# Patient Record
Sex: Male | Born: 1980 | Race: White | Hispanic: No | Marital: Married | Smoking: Never smoker
Health system: Southern US, Community
[De-identification: ages and names within clinical notes are randomized; demographics above are authoritative.]

---

## 2009-03-27 ENCOUNTER — Emergency Department (HOSPITAL_COMMUNITY): Admission: EM | Admit: 2009-03-27 | Discharge: 2009-03-28 | Payer: Self-pay | Admitting: Emergency Medicine

## 2009-04-18 ENCOUNTER — Ambulatory Visit: Payer: Self-pay | Admitting: Vascular Surgery

## 2009-07-25 ENCOUNTER — Ambulatory Visit: Payer: Self-pay | Admitting: Vascular Surgery

## 2009-08-29 ENCOUNTER — Ambulatory Visit: Payer: Self-pay | Admitting: Vascular Surgery

## 2009-09-05 ENCOUNTER — Ambulatory Visit: Payer: Self-pay | Admitting: Vascular Surgery

## 2009-10-31 ENCOUNTER — Ambulatory Visit: Payer: Self-pay | Admitting: Vascular Surgery

## 2009-11-07 ENCOUNTER — Ambulatory Visit: Payer: Self-pay | Admitting: Vascular Surgery

## 2011-04-24 NOTE — Procedures (Signed)
DUPLEX DEEP VENOUS EXAM - LOWER EXTREMITY   INDICATION:  Follow up right greater saphenous vein ablation.   HISTORY:  Edema:  No  Trauma/Surgery:  Right greater saphenous vein ablation on October 31, 2009  Pain:  Moderate  PE:  No  Previous DVT:  No  Anticoagulants:  No  Other:   DUPLEX EXAM:                CFV   SFV   PopV  PTV    GSV                R  L  R  L  R  L  R   L  R  L  Thrombosis    o  o  o     o     o      +  Spontaneous   +  +  +     +     +      o  Phasic        +  +  +     +     +      o  Augmentation  +  +  +     +     +      o  Compressible  +  +  +     +     +      o  Competent     +  +  +     +     +      o   Legend:  + - yes  o - no  p - partial  D - decreased   IMPRESSION:  There does not appear to be any deep vein thrombosis noted  in the right leg.  However, the right greater saphenous vein appears  with thrombus proximally to the knee level.    _____________________________  Quita Skye. Hart Rochester, M.D.   CB/MEDQ  D:  11/07/2009  T:  11/08/2009  Job:  [id number]

## 2011-04-24 NOTE — Assessment & Plan Note (Signed)
OFFICE VISIT   Raymond Bell, Raymond Bell  DOB:  10-Nov-1981                                       09/05/2009  CHART#:[id number]   The patient is 1 week status post left greater saphenous vein laser  ablation plus multiple stab phlebectomies for painful varicosities.  He  has had some moderate discomfort in the left thigh over the ablation  site, which seems to have been more aggravating over the weekend but  seems to be improving slightly now.  He has had no distal edema and no  pain associated with the stab phlebectomies.  There is some moderate  ecchymosis in the distal thigh medially where the entrance site was  located.   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   I think he is coming along nicely.  He will wear his elastic stocking  for 1 more week and continue to take ibuprofen on a diminished dose of 2  tablets twice daily as necessary.  We will schedule his laser ablation  of the right leg in November as per his request.   Quita Skye. Hart Rochester, M.D.  Electronically Signed   JDL/MEDQ  D:  09/05/2009  T:  09/06/2009  Job:  [id number]

## 2011-04-24 NOTE — Procedures (Signed)
LOWER EXTREMITY VENOUS REFLUX EXAM   INDICATION:  Bilateral lower extremity varicose veins.   EXAM:  Using color-flow imaging and pulse Doppler spectral analysis, the  right and left common femoral, superficial femoral, popliteal, posterior  tibial, greater and lesser saphenous veins are evaluated.  There is no  evidence suggesting deep venous insufficiency in the right and left  lower extremity.   The right and left saphenofemoral junctions are not competent.  The  right and left GSV's are not competent with the caliber as described  below.   The right and left proximal short saphenous vein demonstrates  competency.   GSV Diameter (used if found to be incompetent only)                                            Right    Left  Proximal Greater Saphenous Vein           0.79 cm  1.08 cm  Proximal-to-mid-thigh                     0.74 cm  0.91 cm  Mid thigh                                 0.78 cm  0.79 cm  Mid-distal thigh                          0.82 cm  0.80 cm  Distal thigh                              0.82 cm  0.79 cm  Knee                                      0.82 cm  1.21 cm   IMPRESSION:  1. Right and left greater saphenous vein reflux is identified with the      caliber ranging from 1.21 cm to 0.74 cm knee to groin.  2. The right and left greater saphenous veins are not aneurysmal.  3. The right and left greater saphenous veins are not tortuous.  4. The deep venous system is competent.  5. The right and left lesser saphenous veins are competent.   ___________________________________________  Quita Skye. Hart Rochester, M.D.   AC/MEDQ  D:  04/18/2009  T:  04/18/2009  Job:  [id number]

## 2011-04-24 NOTE — Procedures (Signed)
DUPLEX DEEP VENOUS EXAM - LOWER EXTREMITY   INDICATION:  One week post great saphenous vein ablation and phlebectomy   HISTORY:  Edema:  yes  Trauma/Surgery:  no  Pain:  yes  PE:  no  Previous DVT:  no  Anticoagulants:  no  Other:   DUPLEX EXAM:                CFV   SFV   PopV  PTV    GSV                R  L  R  L  R  L  R   L  R  L  Thrombosis    0  0     0     0      0     +  Spontaneous   +  +     +     +      +     0  Phasic        +  +     +     +      +     0  Augmentation  +  +     +     +      +     0  Compressible  +  +     +     +      +     0  Competent     +  +     +     +      +     0   Legend:  + - yes  o - no  p - partial  D - decreased   IMPRESSION:  No evidence of deep vein thrombosis in the left lower  extremity.  The left great saphenous vein is ablated from the  saphenofemoral junction to the proximal calf.    _____________________________  Quita Skye. Hart Rochester, M.D.   CJ/MEDQ  D:  09/05/2009  T:  09/06/2009  Job:  [id number]

## 2011-04-24 NOTE — Assessment & Plan Note (Signed)
OFFICE VISIT   LAVALLE, SKODA  DOB:  1981/02/25                                       07/25/2009  CHART#:[id number]   The patient returns today for further follow-up regarding his bilateral  severe venous insufficiency.  He has bulging varicosities in both legs  with a history of a bleeding varix at the left ankle.  He has worn  elastic compression stockings (long-leg 20-mm - 30-mm gradient) and has  also tried elevating his legs and taking ibuprofen on a daily basis, but  has continued to have symptoms.  He also has noted that the darkening of  the skin in the lower ankle has progressed since I saw him 3 months ago.  He has had no further bleeding episodes.   On exam today, he continues to have bulging varicosities in the left leg  below the knee medially over the great saphenous system, extending into  the pretibial area, with a large patch of hyperpigmentation over the  area where the varix bled laterally proximal to the lateral malleolus.  On the right side he has a large cluster of bulging varicosities  anterior to the great saphenous vein near the patella which also are  contiguous with some bulging varicosities in the medial calf on the  right.  He has 1+ edema bilaterally with some thickening of the skin.   He clearly has worsened over the past 3 months in regards to his skin  color and consistency on the left and the size of his varicosities on  the right.  I think we should proceed with laser ablation of the left  great saphenous vein with multiple stab phlebectomies to be followed by  an identical procedure on the right side in the near future.  Will  proceed with precertification.  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   Quita Skye Hart Rochester, M.D.  Electronically Signed   JDL/MEDQ  D:  07/25/2009  T:  07/26/2009  Job:  [id number]

## 2011-04-24 NOTE — Consult Note (Signed)
NEW PATIENT CONSULTATION   Raymond Bell, Raymond Bell  DOB:  12/13/1980                                       04/18/2009  CHART#:[id number]   The patient was referred by Dr. Kevan Ny for evaluation of bilateral venous  insufficiency, recent history of a bleeding varix in the left leg.  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  This stopped initially and the  re-bled, and he went to the emergency department where a suture was  placed.  He has a long history of varicose veins in both legs,  particularly below the knee in the left side medially and with skin  darkening in the left ankle on the left side where the bleeding  occurred.  he has also had a large patch in the distal thigh in the  right side extending into the calf.  He has never had these evaluated  and has not worn elastic compression stockings, and is not able to  elevate his legs on a regular basis because he is on his feet as a  Engineer, site.  He has no history of deep vein thrombosis,  thrombophlebitis, stasis ulcers, but only the bleeding episode and  aching and throbbing discomfort in both legs as the day progresses,  particularly in the right thigh and calf, and also in the left calf and  ankle.  He has noticed this darkening of the skin over the last 3 years,  particularly on the left side with thickening and crustiness.   PAST MEDICAL HISTORY:  Negative for coronary artery disease and stroke  with father having varicose veins and diabetes.   SOCIAL HISTORY:  He is married, has 2 children, works as a Nurse, mental health, has not smoked since 2007, does not use alcohol.   REVIEW OF SYSTEMS:  Totally unremarkable.   ALLERGIES:  None known.   MEDICATIONS:  Zero.  None.   PHYSICAL EXAM:  Blood pressure is 140/80, heart rate 70, respirations  14.  Generally, he is a  healthy-appearing middle-aged male in no  apparent distress.  Alert and oriented x3.  Neck is supple, 3+ carotid  pulses palpable.  No bruits are audible.  Neurologic exam is normal.  No  palpable adenopathy in the neck.  Chest clear to auscultation.  Cardiovascular exam is regular rate and rhythm with no murmurs.  Abdomen  is soft and nontender with no palpable masses.  He has 3+ femoral,  popliteal, and dorsalis pedis pulses bilaterally.  Right leg has severe  cluster of varicose veins in the distal thigh medial to the knee over  the greater saphenous system extending down to the calf over the greater  saphenous system at the ankle with 1+ edema.  Left leg has lower  distribution of severe bulging varicosities over the great saphenous  system from the knee to the ankle medially with hyperpigmentation.  It  is actually laterally around the mid lateral malleolus where the  bleeding occurred.  1+ edema on the left.   Venous duplex exam done in the office today reveals gross reflux in both  great saphenous systems from the saphenofemoral junction to the knee  communicating with these extensive varicosities and normal deep system.  This man has severe symptomatic venous insufficiency of both legs.  We  will treat him initially with long leg elastic compression stockings (20  mm - 30 mm gradient) as well as elevation as much as his job will  permit, and ibuprofen on a daily basis.  If he has any further bleeding  episodes, he will be back in touch with [place] immediately.  Otherwise, in 3  months, he will return.  At that time, if he has had no improvement, I  think he would be a great candidate for laser ablation of the left  greater saphenous vein with multiple stab phlebectomies to be followed  by laser ablation of the right greater saphenous vein with multiple stab  phlebectomies to treat these problems.   Quita Skye Hart Rochester, M.D.  Electronically Signed   JDL/MEDQ  D:  04/18/2009  T:   04/18/2009  Job:  [id number]   cc:   Duncan Dull, M.D.

## 2011-04-24 NOTE — Assessment & Plan Note (Signed)
OFFICE VISIT   Raymond Bell, Raymond Bell  DOB:  [birth year]                                       11/07/2009  CHART#:[id number]   The patient is 1 week post laser ablation of his right great saphenous  vein with multiple stab phlebectomies for painful varicosities in the  right great saphenous system.  He has done very well with no significant  discomfort on this extremity.  He has had less pain in the thigh and  groin compared to the left side which was not severe.  He states at this  time the left leg feels very good, much better than it did prior to the  procedure.  His right leg is also making good progress with no pain  below the knee in the stab phlebectomy sites.  He has had no distal  edema.  He is wearing his elastic compression stocking and has concluded  his ibuprofen treatment.   Duplex scan today reveals no evidence of deep venous obstruction, total  occlusion of the great saphenous vein from the knee to the  saphenofemoral junction.   I am very pleased with his early result.  I encouraged him to increase  his activity and discontinue his stocking after 1 more week.  If he has  any further problems or questions, he will be in touch with [place] and see  [place] on a p.r.n. basis.   Quita Skye Hart Rochester, M.D.  Electronically Signed   JDL/MEDQ  D:  11/07/2009  T:  11/08/2009  Job:  [id number]

## 2017-11-04 ENCOUNTER — Emergency Department (HOSPITAL_COMMUNITY)
Admission: EM | Admit: 2017-11-04 | Discharge: 2017-11-04 | Disposition: A | Discharge status: Home / Self Care | Payer: Worker's Compensation | Attending: Emergency Medicine | Admitting: Emergency Medicine

## 2017-11-04 ENCOUNTER — Other Ambulatory Visit: Payer: Self-pay | Admitting: Occupational Medicine

## 2017-11-04 ENCOUNTER — Encounter (HOSPITAL_COMMUNITY): Payer: Self-pay

## 2017-11-04 ENCOUNTER — Ambulatory Visit: Payer: Self-pay

## 2017-11-04 ENCOUNTER — Emergency Department (HOSPITAL_COMMUNITY): Payer: Worker's Compensation

## 2017-11-04 ENCOUNTER — Other Ambulatory Visit: Payer: Self-pay

## 2017-11-04 DIAGNOSIS — S92531A Displaced fracture of distal phalanx of right lesser toe(s), initial encounter for closed fracture: Secondary | ICD-10-CM | POA: Diagnosis not present

## 2017-11-04 DIAGNOSIS — S92521A Displaced fracture of medial phalanx of right lesser toe(s), initial encounter for closed fracture: Secondary | ICD-10-CM | POA: Diagnosis not present

## 2017-11-04 DIAGNOSIS — S93124A Dislocation of metatarsophalangeal joint of right lesser toe(s), initial encounter: Secondary | ICD-10-CM | POA: Insufficient documentation

## 2017-11-04 DIAGNOSIS — Y990 Civilian activity done for income or pay: Secondary | ICD-10-CM | POA: Insufficient documentation

## 2017-11-04 DIAGNOSIS — S93104A Unspecified dislocation of right toe(s), initial encounter: Secondary | ICD-10-CM

## 2017-11-04 DIAGNOSIS — W208XXA Other cause of strike by thrown, projected or falling object, initial encounter: Secondary | ICD-10-CM | POA: Diagnosis not present

## 2017-11-04 DIAGNOSIS — Y929 Unspecified place or not applicable: Secondary | ICD-10-CM | POA: Diagnosis not present

## 2017-11-04 DIAGNOSIS — S92414A Nondisplaced fracture of proximal phalanx of right great toe, initial encounter for closed fracture: Secondary | ICD-10-CM | POA: Insufficient documentation

## 2017-11-04 DIAGNOSIS — M79671 Pain in right foot: Secondary | ICD-10-CM

## 2017-11-04 DIAGNOSIS — Z79899 Other long term (current) drug therapy: Secondary | ICD-10-CM | POA: Diagnosis not present

## 2017-11-04 DIAGNOSIS — Y939 Activity, unspecified: Secondary | ICD-10-CM | POA: Insufficient documentation

## 2017-11-04 DIAGNOSIS — S99921A Unspecified injury of right foot, initial encounter: Secondary | ICD-10-CM | POA: Diagnosis present

## 2017-11-04 MED ORDER — OXYCODONE-ACETAMINOPHEN 5-325 MG PO TABS: 1.0000 | ORAL_TABLET | ORAL | 0 refills | Status: DC | PRN

## 2017-11-04 MED ORDER — CEPHALEXIN 500 MG PO CAPS: 500.0000 mg | ORAL_CAPSULE | Freq: Four times a day (QID) | ORAL | 0 refills | Status: DC

## 2017-11-04 MED ORDER — IBUPROFEN 800 MG PO TABS: 800.0000 mg | ORAL_TABLET | Freq: Three times a day (TID) | ORAL | 0 refills | Status: DC

## 2017-11-04 MED ORDER — OXYCODONE-ACETAMINOPHEN 5-325 MG PO TABS
1.0000 | ORAL_TABLET | ORAL | Status: AC | PRN
  Administered 2017-11-04 (×2): 1 via ORAL
  Filled 2017-11-04 (×2): qty 1

## 2017-11-04 MED ORDER — BUPIVACAINE HCL (PF) 0.5 % IJ SOLN
10.0000 mL | Freq: Once | INTRAMUSCULAR | Status: AC
  Administered 2017-11-04: 10 mL
  Filled 2017-11-04 (×2): qty 10

## 2017-11-04 MED ORDER — LIDOCAINE HCL (PF) 1 % IJ SOLN
10.0000 mL | Freq: Once | INTRAMUSCULAR | Status: DC
  Filled 2017-11-04: qty 10

## 2017-11-04 MED ORDER — OXYCODONE-ACETAMINOPHEN 5-325 MG PO TABS
1.0000 | ORAL_TABLET | Freq: Once | ORAL | Status: AC
  Administered 2017-11-04: 1 via ORAL
  Filled 2017-11-04: qty 1

## 2017-11-04 NOTE — ED Provider Notes (Signed)
[facility] EMERGENCY DEPARTMENT Provider Note   CSN: [id number] Arrival date & time: 11/04/17  1151     History   Chief Complaint Chief Complaint  Patient presents with  . Foot Injury    HPI Raymond Bell is a 36 y.o. male with no significant past medical history presenting from urgent care with right foot injury. X-rays were obtained at urgent care and he was told that all toes were fractured. Patient was sent to the emergency department for further evaluation. He explains that he was at work when a heavy volleyball net base dropped on his right foot. Patient was given analgesia on arrival and reports improvement in pain. No numbness.  HPI  History reviewed. No pertinent past medical history.  There are no active problems to display for this patient.   History reviewed. No pertinent surgical history.     Home Medications    Prior to Admission medications   Medication Sig Start Date End Date Taking? Authorizing Provider  acetaminophen (TYLENOL) 325 MG tablet Take 650 mg by mouth every 6 (six) hours as needed for mild pain.   Yes [provider]  cephALEXin (KEFLEX) 500 MG capsule Take 1 capsule (500 mg total) by mouth 4 (four) times daily. 11/04/17   Emeline General, PA-C  ibuprofen (ADVIL,MOTRIN) 800 MG tablet Take 1 tablet (800 mg total) by mouth 3 (three) times daily. 11/04/17   Emeline General, PA-C  oxyCODONE-acetaminophen (PERCOCET/ROXICET) 5-325 MG tablet Take 1-2 tablets by mouth every 4 (four) hours as needed for severe pain. 11/04/17   Emeline General, PA-C    Family History No family history on file.  Social History Social History   Tobacco Use  . Smoking status: Never Smoker  . Smokeless tobacco: Never Used  Substance Use Topics  . Alcohol use: Yes    Comment: Occasionally   . Drug use: No     Allergies   Patient has no known allergies.   Review of Systems Review of Systems  Constitutional: Negative  for diaphoresis.  Musculoskeletal: Positive for arthralgias and myalgias.  Skin: Positive for color change and wound. Negative for pallor.  Neurological: Negative for syncope, weakness and numbness.     Physical Exam Updated Vital Signs BP 130/84 (BP Location: Right Arm)   Pulse 84   Temp 98.6 F (37 C) (Oral)   Resp 16   Ht 6\' 5"  (1.956 m)   Wt 108.9 kg (240 lb)   SpO2 97%   BMI 28.46 kg/m   Physical Exam  Constitutional: He is oriented to person, place, and time. He appears well-developed and well-nourished. No distress.  Afebrile, nontoxic-appearing, sitting comfortably in chair in no acute distress.  HENT:  Head: Normocephalic and atraumatic.  Eyes: Conjunctivae and EOM are normal.  Neck: Normal range of motion.  Cardiovascular: Normal rate, regular rhythm, normal heart sounds and intact distal pulses.  No murmur heard. Pulmonary/Chest: Effort normal and breath sounds normal. No stridor. No respiratory distress. He has no wheezes. He has no rales.  Musculoskeletal: Normal range of motion. He exhibits edema, tenderness and deformity.  Small puncture wound to dorsum of the foot at MTP. Patient was able to flex and extend the toes. Ecchymosis to 5th toe and dorsum of the foot, mild edema.  Neurological: He is alert and oriented to person, place, and time. No sensory deficit. He exhibits normal muscle tone.  Strong pulses, sensation intact. NVI. Patient was ambulating using his right heel  Skin: Skin  is warm and dry. He is not diaphoretic. No erythema. No pallor.  Psychiatric: He has a normal mood and affect.  Nursing note and vitals reviewed.    ED Treatments / Results  Labs (all labs ordered are listed, but only abnormal results are displayed) Labs Reviewed - No data to display  EKG  EKG Interpretation None       Radiology Dg Foot Complete Right  Result Date: 11/04/2017 CLINICAL DATA:  Right foot pain.  Blunt trauma. EXAM: RIGHT FOOT COMPLETE - 3+ VIEW  COMPARISON:  None. FINDINGS: Fractures are noted through the first through third proximal phalanges. Fracture fragments are mildly displaced. Presumed acute fracture in the middle phalanx of the fifth toe. Questionable subluxation or dislocation at the DIP joint of the right fourth toe. IMPRESSION: Comminuted mildly displaced fractures through the first through third proximal phalanges. Presumed acute fracture in the middle phalanx of the right fifth toe. Subluxation or dislocation of the right fourth toe PIP joint. Electronically Signed   By: Rolm Baptise M.D.   On: 11/04/2017 10:45    Procedures Procedures (including critical care time)  NERVE BLOCK Performed by: Emeline General Consent: Verbal consent obtained. Required items: required blood products, implants, devices, and special equipment available Time out: Immediately prior to procedure a "time out" was called to verify the correct patient, procedure, equipment, support staff and site/side marked as required.  Indication: dislocation Nerve block body site: 4th right toe  Preparation: Patient was prepped and draped in the usual sterile fashion. Needle gauge: 25 G Location technique: anatomical landmarks  Local anesthetic: marcaine 0.5%  Anesthetic total: 4 ml  Outcome: pain improved Patient tolerance: Patient tolerated the procedure well with no immediate complications.   Reduction of dislocation Date/Time: 5:18 PM Performed by: Emeline General Authorized by: Emeline General Consent: Verbal consent obtained. Risks and benefits: risks, benefits and alternatives were discussed Consent given by: patient Required items: required blood products, implants, devices, and special equipment available Time out: Immediately prior to procedure a "time out" was called to verify the correct patient, procedure, equipment, support staff and site/side marked as required.  Patient sedated: no  Vitals: Vital signs were monitored    Patient tolerance: Patient tolerated the procedure well with no immediate complications. Joint: interphalangeal 4th toe right Reduction technique: manual traction    Medications Ordered in ED Medications  bupivacaine (MARCAINE) 0.5 % injection 10 mL (not administered)  lidocaine (PF) (XYLOCAINE) 1 % injection 10 mL (not administered)  oxyCODONE-acetaminophen (PERCOCET/ROXICET) 5-325 MG per tablet 1 tablet (1 tablet Oral Given 11/04/17 1415)     Initial Impression / Assessment and Plan / ED Course  I have reviewed the triage vital signs and the nursing notes.  Pertinent labs & imaging results that were available during my care of the patient were reviewed by me and considered in my medical decision making (see chart for details).     Patient presenting with right foot injury with multiple phalangeal fractures and 4th toe dislocation. Pain was well managed while in the emergency department.  Consult placed to ortho Spoke to ortho PA Legrand Como who consulted with Dr. Lyla Glassing and recommends post-op hard sole shoe, keflex and follow up in office with Dr. Doreatha Martin  Digital block performed to right 4th toe 4th toe dislocation was reduced successfully.  Dressing applied, post op shoe and crutches provided to patient. He was well-appearing and in no distress prior to discharge. Normal vital signs and stable.  Discharge home with Keflex,  symptomatic relief and close follow-up with orthopedic.  Discussed strict return precautions and advised to return to the emergency department if experiencing any new or worsening symptoms. Instructions were understood and patient agreed with discharge plan. Final Clinical Impressions(s) / ED Diagnoses   Final diagnoses:  Closed nondisplaced fracture of proximal phalanx of right great toe, initial encounter  Closed displaced fracture of middle phalanx of lesser toe of right foot, initial encounter  Closed displaced fracture of distal phalanx of lesser toe  of right foot, initial encounter  Dislocation of phalanx of right foot, initial encounter    ED Discharge Orders        Ordered    cephALEXin (KEFLEX) 500 MG capsule  4 times daily,   Status:  Discontinued     11/04/17 1534    cephALEXin (KEFLEX) 500 MG capsule  4 times daily,   Status:  Discontinued     11/04/17 1632    oxyCODONE-acetaminophen (PERCOCET/ROXICET) 5-325 MG tablet  Every 4 hours PRN,   Status:  Discontinued     11/04/17 1632    ibuprofen (ADVIL,MOTRIN) 800 MG tablet  3 times daily,   Status:  Discontinued     11/04/17 1632    cephALEXin (KEFLEX) 500 MG capsule  4 times daily     11/04/17 1635    ibuprofen (ADVIL,MOTRIN) 800 MG tablet  3 times daily     11/04/17 1635    oxyCODONE-acetaminophen (PERCOCET/ROXICET) 5-325 MG tablet  Every 4 hours PRN     11/04/17 1635       Dossie Der 11/04/17 1745    Julianne Rice, MD 11/04/17 1909

## 2017-11-04 NOTE — ED Triage Notes (Signed)
Per Pt, Pt was at work where a base of a volleyball net was dropped on his right foot. Pt was sent to a clinic to be evaluated and noted to have five broken toes noted on the right foot. Pt was sent her for evaluation.

## 2017-11-04 NOTE — Discharge Instructions (Addendum)
Call Dr. Doreatha Martin office to schedule follow up appointment. Do not put weight on your foot, wear post op hard sole shoe and use crutches to ambulate.  Take ibuprofen 800 every 8 hours and Percocet as needed for breakthrough pain. Follow the rice protocol as outlined in these instructions

## 2017-11-04 NOTE — ED Notes (Signed)
States he had a pole from the volleyball court call on his foot. Was sent from [facility] for further follow up

## 2017-11-04 NOTE — ED Notes (Signed)
Awaiting marcaine from pharmacy

## 2017-11-04 NOTE — ED Notes (Signed)
Ice pack applied to foot and foot elevated.

## 2019-10-01 ENCOUNTER — Other Ambulatory Visit: Payer: Self-pay | Admitting: Family Medicine

## 2019-10-01 DIAGNOSIS — R1010 Upper abdominal pain, unspecified: Secondary | ICD-10-CM

## 2019-10-02 ENCOUNTER — Ambulatory Visit
Admission: RE | Admit: 2019-10-02 | Discharge: 2019-10-02 | Disposition: A | Discharge status: Home / Self Care | Payer: BC Managed Care – PPO | Source: Ambulatory Visit | Attending: Family Medicine | Admitting: Family Medicine

## 2019-10-02 DIAGNOSIS — R1010 Upper abdominal pain, unspecified: Secondary | ICD-10-CM

## 2019-10-09 ENCOUNTER — Other Ambulatory Visit: Payer: Self-pay | Admitting: Family Medicine

## 2019-10-09 DIAGNOSIS — K769 Liver disease, unspecified: Secondary | ICD-10-CM

## 2019-10-29 ENCOUNTER — Other Ambulatory Visit: Payer: Self-pay

## 2019-10-29 ENCOUNTER — Ambulatory Visit
Admission: RE | Admit: 2019-10-29 | Discharge: 2019-10-29 | Disposition: A | Discharge status: Home / Self Care | Payer: BC Managed Care – PPO | Source: Ambulatory Visit | Attending: Family Medicine | Admitting: Family Medicine

## 2019-10-29 DIAGNOSIS — K769 Liver disease, unspecified: Secondary | ICD-10-CM

## 2019-10-29 MED ORDER — GADOBENATE DIMEGLUMINE 529 MG/ML IV SOLN
20.0000 mL | Freq: Once | INTRAVENOUS | Status: AC | PRN
  Administered 2019-10-29: 20 mL via INTRAVENOUS

## 2019-10-31 ENCOUNTER — Other Ambulatory Visit: Payer: BC Managed Care – PPO

## 2020-01-30 ENCOUNTER — Other Ambulatory Visit: Payer: Self-pay | Admitting: Nurse Practitioner

## 2020-01-30 DIAGNOSIS — K769 Liver disease, unspecified: Secondary | ICD-10-CM

## 2020-02-24 ENCOUNTER — Ambulatory Visit
Admission: RE | Admit: 2020-02-24 | Discharge: 2020-02-24 | Disposition: A | Discharge status: Home / Self Care | Payer: BC Managed Care – PPO | Source: Ambulatory Visit | Attending: Nurse Practitioner | Admitting: Nurse Practitioner

## 2020-02-24 ENCOUNTER — Other Ambulatory Visit: Payer: Self-pay

## 2020-02-24 DIAGNOSIS — K769 Liver disease, unspecified: Secondary | ICD-10-CM

## 2020-02-24 MED ORDER — GADOXETATE DISODIUM 0.25 MMOL/ML IV SOLN
10.0000 mL | Freq: Once | INTRAVENOUS | Status: AC | PRN
  Administered 2020-02-24: 10 mL via INTRAVENOUS

## 2020-03-07 ENCOUNTER — Other Ambulatory Visit (HOSPITAL_COMMUNITY): Payer: Self-pay | Admitting: Nurse Practitioner

## 2020-03-07 DIAGNOSIS — K769 Liver disease, unspecified: Secondary | ICD-10-CM

## 2020-03-08 ENCOUNTER — Other Ambulatory Visit: Payer: Self-pay | Admitting: Radiology

## 2020-03-08 ENCOUNTER — Encounter (HOSPITAL_COMMUNITY): Payer: Self-pay | Admitting: Radiology

## 2020-03-08 NOTE — Progress Notes (Signed)
Raymond Bell Male, 39 y.o., December 24, 1980 MRN:  [id number] Phone:  [phone] (M) ... PCP:  London Pepper, MD Coverage:  Sherre Poot [facility] [place] With Radiology ([facility]-US 2) 03/11/2020 at 1:00 PM  FW: Biopsy Received: Today Message Contents  Donn Pierini D      Previous Messages   ----- Message -----  From: Arne Cleveland, MD  Sent: 03/07/2020  4:45 PM EDT  To: Lenore Cordia  Subject: RE: Biopsy                    Ok   [place] core R liver lesion  See MR and [place]   DDH    ----- Message -----  From: Lenore Cordia  Sent: 03/07/2020  3:45 PM EDT  To: Ir Procedure Requests  Subject: Biopsy                      Procedure Requested: Targeted Percutaneous Liver Biopsy    Reason for Procedure: Biopsy of Right hepatic lobe Lesion ( [place] vs Adenoma some T2 hyperintensity 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

## 2020-03-10 ENCOUNTER — Other Ambulatory Visit: Payer: Self-pay | Admitting: Radiology

## 2020-03-11 ENCOUNTER — Encounter (HOSPITAL_COMMUNITY): Payer: Self-pay

## 2020-03-11 ENCOUNTER — Other Ambulatory Visit (HOSPITAL_COMMUNITY): Payer: Self-pay | Admitting: Interventional Radiology

## 2020-03-11 ENCOUNTER — Ambulatory Visit (HOSPITAL_COMMUNITY)
Admission: RE | Admit: 2020-03-11 | Discharge: 2020-03-11 | Disposition: A | Discharge status: Home / Self Care | Payer: BC Managed Care – PPO | Source: Ambulatory Visit | Attending: Nurse Practitioner | Admitting: Nurse Practitioner

## 2020-03-11 ENCOUNTER — Other Ambulatory Visit (HOSPITAL_COMMUNITY): Payer: Self-pay | Admitting: Nurse Practitioner

## 2020-03-11 ENCOUNTER — Ambulatory Visit (HOSPITAL_COMMUNITY)
Admission: RE | Admit: 2020-03-11 | Discharge: 2020-03-11 | Disposition: A | Discharge status: Home / Self Care | Payer: BC Managed Care – PPO | Source: Ambulatory Visit | Attending: Interventional Radiology | Admitting: Interventional Radiology

## 2020-03-11 ENCOUNTER — Other Ambulatory Visit: Payer: Self-pay

## 2020-03-11 DIAGNOSIS — R160 Hepatomegaly, not elsewhere classified: Secondary | ICD-10-CM

## 2020-03-11 DIAGNOSIS — K769 Liver disease, unspecified: Secondary | ICD-10-CM

## 2020-03-11 DIAGNOSIS — K7689 Other specified diseases of liver: Secondary | ICD-10-CM | POA: Diagnosis not present

## 2020-03-11 LAB — PROTIME-INR
INR: 1 (ref 0.8–1.2)
Prothrombin Time: 12.7 seconds (ref 11.4–15.2)

## 2020-03-11 LAB — CBC WITH DIFFERENTIAL/PLATELET
Abs Immature Granulocytes: 0.02 10*3/uL (ref 0.00–0.07)
Basophils Absolute: 0 10*3/uL (ref 0.0–0.1)
Basophils Relative: 1 %
Eosinophils Absolute: 0 10*3/uL (ref 0.0–0.5)
Eosinophils Relative: 1 %
HCT: 47.9 % (ref 39.0–52.0)
Hemoglobin: 15.9 g/dL (ref 13.0–17.0)
Immature Granulocytes: 0 %
Lymphocytes Relative: 23 %
Lymphs Abs: 1.1 10*3/uL (ref 0.7–4.0)
MCH: 31.2 pg (ref 26.0–34.0)
MCHC: 33.2 g/dL (ref 30.0–36.0)
MCV: 94.1 fL (ref 80.0–100.0)
Monocytes Absolute: 0.3 10*3/uL (ref 0.1–1.0)
Monocytes Relative: 7 %
Neutro Abs: 3.1 10*3/uL (ref 1.7–7.7)
Neutrophils Relative %: 68 %
Platelets: 215 10*3/uL (ref 150–400)
RBC: 5.09 MIL/uL (ref 4.22–5.81)
RDW: 11.4 % — ABNORMAL LOW (ref 11.5–15.5)
WBC: 4.6 10*3/uL (ref 4.0–10.5)
nRBC: 0 % (ref 0.0–0.2)

## 2020-03-11 LAB — COMPREHENSIVE METABOLIC PANEL
ALT: 31 U/L (ref 0–44)
AST: 47 U/L — ABNORMAL HIGH (ref 15–41)
Albumin: 3.9 g/dL (ref 3.5–5.0)
Alkaline Phosphatase: 82 U/L (ref 38–126)
Anion gap: 10 (ref 5–15)
BUN: 17 mg/dL (ref 6–20)
CO2: 24 mmol/L (ref 22–32)
Calcium: 9.2 mg/dL (ref 8.9–10.3)
Chloride: 102 mmol/L (ref 98–111)
Creatinine, Ser: 0.95 mg/dL (ref 0.61–1.24)
GFR calc Af Amer: >60 mL/min (ref >60)
GFR calc non Af Amer: >60 mL/min (ref >60)
Glucose, Bld: 100 mg/dL — ABNORMAL HIGH (ref 70–99)
Potassium: 4.5 mmol/L (ref 3.5–5.1)
Sodium: 136 mmol/L (ref 135–145)
Total Bilirubin: 0.9 mg/dL (ref 0.3–1.2)
Total Protein: 6.9 g/dL (ref 6.5–8.1)

## 2020-03-11 MED ORDER — LIDOCAINE HCL (PF) 1 % IJ SOLN
INTRAMUSCULAR | Status: AC
  Filled 2020-03-11: qty 30

## 2020-03-11 MED ORDER — FENTANYL CITRATE (PF) 100 MCG/2ML IJ SOLN
INTRAMUSCULAR | Status: AC | PRN
  Administered 2020-03-11: 25 ug via INTRAVENOUS
  Administered 2020-03-11 (×2): 50 ug via INTRAVENOUS
  Administered 2020-03-11: 25 ug via INTRAVENOUS

## 2020-03-11 MED ORDER — MIDAZOLAM HCL 2 MG/2ML IJ SOLN
INTRAMUSCULAR | Status: AC
  Filled 2020-03-11: qty 2

## 2020-03-11 MED ORDER — FENTANYL CITRATE (PF) 100 MCG/2ML IJ SOLN
INTRAMUSCULAR | Status: AC
  Filled 2020-03-11: qty 2

## 2020-03-11 MED ORDER — GELATIN ABSORBABLE 12-7 MM EX MISC
CUTANEOUS | Status: AC
  Filled 2020-03-11: qty 1

## 2020-03-11 MED ORDER — MIDAZOLAM HCL 2 MG/2ML IJ SOLN
INTRAMUSCULAR | Status: AC | PRN
  Administered 2020-03-11: 1 mg via INTRAVENOUS
  Administered 2020-03-11 (×3): 0.5 mg via INTRAVENOUS

## 2020-03-11 NOTE — Discharge Instructions (Signed)
Liver Biopsy, Care After These instructions give you information on caring for yourself after your procedure. Your doctor may also give you more specific instructions. Call your doctor if you have any problems or questions after your procedure. What can I expect after the procedure? After the procedure, it is common to have:  Pain and soreness where the biopsy was done.  Bruising around the area where the biopsy was done.  Sleepiness and be tired for a few days. Follow these instructions at home: Medicines  Take over-the-counter and prescription medicines only as told by your doctor.  If you were prescribed an antibiotic medicine, take it as told by your doctor. Do not stop taking the antibiotic even if you start to feel better.  Do not take medicines such as aspirin and ibuprofen. These medicines can thin your blood. Do not take these medicines unless your doctor tells you to take them.  If you are taking prescription pain medicine, take actions to prevent or treat constipation. Your doctor may recommend that you: ? Drink enough fluid to keep your pee (urine) clear or pale yellow. ? Take over-the-counter or prescription medicines. ? Eat foods that are high in fiber, such as fresh fruits and vegetables, whole grains, and beans. ? Limit foods that are high in fat and processed sugars, such as fried and sweet foods. Caring for your cut  Follow instructions from your doctor about how to take care of your cuts from surgery (incisions). Make sure you: ? Wash your hands with soap and water before you change your bandage (dressing). If you cannot use soap and water, use hand sanitizer. ? Remove your bandage as told by your doctor. In 24 hours  Check your cuts every day for signs of infection. Check for: ? Redness, swelling, or more pain. ? Fluid or blood. ? Pus or a bad smell. ? Warmth.  Do not take baths, swim, or use a hot tub until your doctor says it is okay to do  so. Activity   Rest at home for 1-2 days or as told by your doctor. ? Avoid sitting for a long time without moving. Get up to take short walks every 1-2 hours.  Return to your normal activities as told by your doctor. Ask what activities are safe for you.  Do not do these things in the first 24 hours: ? Drive. ? Use machinery. ? Take a bath or shower.  Do not lift more than 10 pounds (4.5 kg) or play contact sports for the first 2 weeks. General instructions   Do not drink alcohol in the first week after the procedure.  Have someone stay with you for at least 24 hours after the procedure.  Get your test results. Ask your doctor or the department that is doing the test: ? When will my results be ready? ? How will I get my results? ? What are my treatment options? ? What other tests do I need? ? What are my next steps?  Keep all follow-up visits as told by your doctor. This is important. Contact a doctor if:  A cut bleeds and leaves more than just a small spot of blood.  A cut is red, puffs up (swells), or hurts more than before.  Fluid or something else comes from a cut.  A cut smells bad.  You have a fever or chills. Get help right away if:  You have swelling, bloating, or pain in your belly (abdomen).  You get dizzy or faint.    You have a rash.  You feel sick to your stomach (nauseous) or throw up (vomit).  You have trouble breathing, feel short of breath, or feel faint.  Your chest hurts.  You have problems talking or seeing.  You have trouble with your balance or moving your arms or legs. Summary  After the procedure, it is common to have pain, soreness, bruising, and tiredness.  Your doctor will tell you how to take care of yourself at home. Change your bandage, take your medicines, and limit your activities as told by your doctor.  Call your doctor if you have symptoms of infection. Get help right away if your belly swells, your cut bleeds a lot, or  you have trouble talking or breathing. This information is not intended to replace advice given to you by your health care provider. Make sure you discuss any questions you have with your health care provider. Document Revised: 12/06/2017 Document Reviewed: 12/06/2017 Elsevier Patient Education  2020 [employer]  Moderate Conscious Sedation, Adult, Care After These instructions provide you with information about caring for yourself after your procedure. Your health care provider may also give you more specific instructions. Your treatment has been planned according to current medical practices, but problems sometimes occur. Call your health care provider if you have any problems or questions after your procedure. What can I expect after the procedure? After your procedure, it is common:  To feel sleepy for several hours.  To feel clumsy and have poor balance for several hours.  To have poor judgment for several hours.  To vomit if you eat too soon. Follow these instructions at home: For at least 24 hours after the procedure:   Do not: ? Participate in activities where you could fall or become injured. ? Drive. ? Use heavy machinery. ? Drink alcohol. ? Take sleeping pills or medicines that cause drowsiness. ? Make important decisions or sign legal documents. ? Take care of children on your own.  Rest. Eating and drinking  Follow the diet recommended by your health care provider.  If you vomit: ? Drink water, juice, or soup when you can drink without vomiting. ? Make sure you have little or no nausea before eating solid foods. General instructions  Have a responsible adult stay with you until you are awake and alert.  Take over-the-counter and prescription medicines only as told by your health care provider.  If you smoke, do not smoke without supervision.  Keep all follow-up visits as told by your health care provider. This is important. Contact a health care provider  if:  You keep feeling nauseous or you keep vomiting.  You feel light-headed.  You develop a rash.  You have a fever. Get help right away if:  You have trouble breathing. This information is not intended to replace advice given to you by your health care provider. Make sure you discuss any questions you have with your health care provider. Document Revised: 11/08/2017 Document Reviewed: 03/17/2016 Elsevier Patient Education  2020 [employer] Inc[employer]  

## 2020-03-11 NOTE — Sedation Documentation (Signed)
Per MD we will need to do this procedure in ct scan. Pt has been moved to ct scan, on monitor. Pt is alert and oriented x4, npo. Consent signed

## 2020-03-11 NOTE — Discharge Instructions (Addendum)
Liver Biopsy, Care After These instructions give you information about how to care for yourself after your procedure. Your health care provider may also give you more specific instructions. If you have problems or questions, contact your health care provider. What can I expect after the procedure? After your procedure, it is common to have:  Pain and soreness in the area where the biopsy was done.  Bruising around the area where the biopsy was done.  Sleepiness and fatigue for 1-2 days. Follow these instructions at home: Medicines  Take over-the-counter and prescription medicines only as told by your health care provider.  If you were prescribed an antibiotic medicine, take it as told by your health care provider. Do not stop taking the antibiotic even if you start to feel better.  Do not take medicines such as aspirin and ibuprofen unless your health care provider tells you to take them. These medicines thin your blood and can increase the risk of bleeding.  If you are taking prescription pain medicine, take actions to prevent or treat constipation. Your health care provider may recommend that you: ? Drink enough fluid to keep your urine pale yellow. ? Eat foods that are high in fiber, such as fresh fruits and vegetables, whole grains, and beans. ? Limit foods that are high in fat and processed sugars, such as fried or sweet foods. ? Take an over-the-counter or prescription medicine for constipation. Incision care  Follow instructions from your health care provider about how to take care of your incision. Make sure you: ? Wash your hands with soap and water before you change your bandage (dressing). If soap and water are not available, use hand sanitizer. ? Change your dressing as told by your health care provider. ? Leave stitches (sutures), skin glue, or adhesive strips in place. These skin closures may need to stay in place for 2 weeks or longer. If adhesive strip edges start to  loosen and curl up, you may trim the loose edges. Do not remove adhesive strips completely unless your health care provider tells you to do that.  Check your incision area every day for signs of infection. Check for: ? Redness, swelling, or pain. ? Fluid or blood. ? Warmth. ? Pus or a bad smell.  Do not take baths, swim, or use a hot tub until your health care provider says it is okay to do so. Activity   Rest at home for 1-2 days, or as directed by your health care provider. ? Avoid sitting for a long time without moving. Get up to take short walks every 1-2 hours. This is important to improve blood flow and breathing. Ask for help if you feel weak or unsteady.  Return to your normal activities as told by your health care provider. Ask your health care provider what activities are safe for you.  Do not drive or use heavy machinery while taking prescription pain medicine.  Do not lift anything that is heavier than 10 lb (4.5 kg), or the limit that your health care provider tells you, until he or she says that it is safe.  Do not play contact sports for 2 weeks after the procedure. General instructions   Do not drink alcohol in the first week after the procedure.  Have someone stay with you for at least 24 hours after the procedure.  It is your responsibility to obtain your test results. Ask your health care provider, or the department that is doing the test: ? When will my   results be ready? ? How will I get my results? ? What are my treatment options? ? What other tests do I need? ? What are my next steps?  Keep all follow-up visits as told by your health care provider. This is important. Contact a health care provider if:  You have increased bleeding from an incision, resulting in more than a small spot of blood.  You have redness, swelling, or increasing pain in any incisions.  You notice a discharge or a bad smell coming from any of your incisions.  You have a fever or  chills. Get help right away if:  You develop swelling, bloating, or pain in your abdomen.  You become dizzy or faint.  You develop a rash.  You have nausea or you vomit.  You faint, or you have shortness of breath or difficulty breathing.  You develop chest pain.  You have problems with your speech or vision.  You have trouble with your balance or moving your arms or legs. Summary  After the liver biopsy, it is common to have pain, soreness, and bruising in the area, as well as sleepiness and fatigue.  Take over-the-counter and prescription medicines only as told by your health care provider.  Follow instructions from your health care provider about how to care for your incision. Check the incision area daily for signs of infection. This information is not intended to replace advice given to you by your health care provider. Make sure you discuss any questions you have with your health care provider. Document Revised: 01/19/2019 Document Reviewed: 12/06/2017 Elsevier Patient Education  2020 [employer] Inc[employer] Moderate Conscious Sedation, Adult Sedation is the use of medicines to promote relaxation and relieve discomfort and anxiety. Moderate conscious sedation is a type of sedation. Under moderate conscious sedation, you are less alert than normal, but you are still able to respond to instructions, touch, or both. Moderate conscious sedation is used during short medical and dental procedures. It is milder than deep sedation, which is a type of sedation under which you cannot be easily woken up. It is also milder than general anesthesia, which is the use of medicines to make you unconscious. Moderate conscious sedation allows you to return to your regular activities sooner. Tell a health care provider about:  Any allergies you have.  All medicines you are taking, including vitamins, herbs, eye drops, creams, and over-the-counter medicines.  Use of steroids (by mouth or creams).  Any  problems you or family members have had with sedatives and anesthetic medicines.  Any blood disorders you have.  Any surgeries you have had.  Any medical conditions you have, such as sleep apnea.  Whether you are pregnant or may be pregnant.  Any use of cigarettes, alcohol, marijuana, or street drugs. What are the risks? Generally, this is a safe procedure. However, problems may occur, including:  Getting too much medicine (oversedation).  Nausea.  Allergic reaction to medicines.  Trouble breathing. If this happens, a breathing tube may be used to help with breathing. It will be removed when you are awake and breathing on your own.  Heart trouble.  Lung trouble. What happens before the procedure? Staying hydrated Follow instructions from your health care provider about hydration, which may include:  Up to 2 hours before the procedure - you may continue to drink clear liquids, such as water, clear fruit juice, black coffee, and plain tea. Eating and drinking restrictions Follow instructions from your health care provider about eating and drinking, which   may include:  8 hours before the procedure - stop eating heavy meals or foods such as meat, fried foods, or fatty foods.  6 hours before the procedure - stop eating light meals or foods, such as toast or cereal.  6 hours before the procedure - stop drinking milk or drinks that contain milk.  2 hours before the procedure - stop drinking clear liquids. Medicine Ask your health care provider about:  Changing or stopping your regular medicines. This is especially important if you are taking diabetes medicines or blood thinners.  Taking medicines such as aspirin and ibuprofen. These medicines can thin your blood. Do not take these medicines before your procedure if your health care provider instructs you not to.  Tests and exams  You will have a physical exam.  You may have blood tests done to show: ? How well your  kidneys and liver are working. ? How well your blood can clot. General instructions  Plan to have someone take you home from the hospital or clinic.  If you will be going home right after the procedure, plan to have someone with you for 24 hours. What happens during the procedure?  An IV tube will be inserted into one of your veins.  Medicine to help you relax (sedative) will be given through the IV tube.  The medical or dental procedure will be performed. What happens after the procedure?  Your blood pressure, heart rate, breathing rate, and blood oxygen level will be monitored often until the medicines you were given have worn off.  Do not drive for 24 hours. This information is not intended to replace advice given to you by your health care provider. Make sure you discuss any questions you have with your health care provider. Document Revised: 11/08/2017 Document Reviewed: 03/17/2016 Elsevier Patient Education  2020 Elsevier Inc.  

## 2020-03-11 NOTE — Progress Notes (Signed)
Discharge instructions reviewed with patient and wife Raymond Bell. Verbalized understanding and questions were answered.

## 2020-03-11 NOTE — H&P (Signed)
Chief Complaint: Patient was seen in consultation today for liver lesion biopsy at the request of Drazek,Dawn  Referring Physician(s): Drazek,Dawn  Supervising Physician: Daryll Brod  Patient Status: [facility] - Out-pt  History of Present Illness: Raymond Bell is a 39 y.o. 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 Recommend follow-up by MRI without and with Eovist contrast in 6 months, after cessation of oral contraceptives or other exogenous steroids if applicable  Was referred to Wendie Chess NP for consultation Another MRI recommended: 02/24/20: IMPRESSION: Unchanged size of 5.6 cm mass of the right lobe of the liver, which demonstrates imaging features consistent with focal nodular hyperplasia, confidently diagnosed on this examination with inclusion of delayed hepatobiliary phase.  Per D Drazek note: Reason for Procedure: Biopsy of Right hepatic lobe Lesion ( FNH vs Adenoma some T2 hyperintensity raising concern for inflammatory adenoma)   Scheduled now for biopsy Still with some abd pain; denies N/V/D/C or wt loss  History reviewed. No pertinent past medical history.  History reviewed. No pertinent surgical history.  Allergies: Patient has no known allergies.  Medications: Prior to Admission medications   Medication Sig Start Date End Date Taking? Authorizing Provider  acetaminophen (TYLENOL) 325 MG tablet Take 650 mg by mouth every 6 (six) hours as needed for mild pain.   Yes [provider]  MELATONIN PO Take 1 tablet by mouth at bedtime.   Yes [provider]  naproxen sodium (ALEVE) 220 MG tablet Take 440 mg by mouth daily as needed (pain).   Yes [provider]     History reviewed. No pertinent family  history.  Social History   Socioeconomic History  . Marital status: Married    Spouse name: Not on file  . Number of children: Not on file  . Years of education: Not on file  . Highest education level: Not on file  Occupational History  . Not on file  Tobacco Use  . Smoking status: Never Smoker  . Smokeless tobacco: Never Used  Substance and Sexual Activity  . Alcohol use: Yes    Comment: Occasionally   . Drug use: No  . Sexual activity: Not on file  Other Topics Concern  . Not on file  Social History Narrative  . Not on file   Social Determinants of Health   Financial Resource Strain:   . Difficulty of Paying Living Expenses:   Food Insecurity:   . Worried About Charity fundraiser in the Last Year:   . Arboriculturist in the Last Year:   Transportation Needs:   . Film/video editor (Medical):   Marland Kitchen Lack of Transportation (Non-Medical):   Physical Activity:   . Days of Exercise per Week:   . Minutes of Exercise per Session:   Stress:   . Feeling of Stress :   Social Connections:   . Frequency of Communication with Friends and Family:   . Frequency of Social Gatherings with Friends and Family:   . Attends Religious Services:   . Active Member of Clubs or Organizations:   . Attends Archivist Meetings:   Marland Kitchen Marital Status:      Review of Systems: A 12 point ROS discussed and pertinent positives are indicated in the HPI above.  All other  systems are negative.  Review of Systems  Constitutional: Negative for activity change, appetite change, fatigue and unexpected weight change.  Respiratory: Negative for cough and shortness of breath.   Cardiovascular: Negative for chest pain.  Gastrointestinal: Positive for abdominal pain.  Musculoskeletal: Negative for back pain.  Neurological: Negative for weakness.  Psychiatric/Behavioral: Negative for behavioral problems and confusion.    Vital Signs: BP (!) 123/95   Pulse (!) 59   Temp 97.7 F (36.5 C)  (Oral)   Resp 17   Ht 6\' 5"  (1.956 m)   Wt 250 lb (113.4 kg)   SpO2 100%   BMI 29.65 kg/m   Physical Exam Vitals reviewed.  Cardiovascular:     Rate and Rhythm: Normal rate and regular rhythm.     Heart sounds: Normal heart sounds.  Pulmonary:     Effort: Pulmonary effort is normal.     Breath sounds: Normal breath sounds.  Abdominal:     Palpations: Abdomen is soft.     Tenderness: There is no abdominal tenderness.  Musculoskeletal:        General: Normal range of motion.  Skin:    General: Skin is warm.  Neurological:     Mental Status: He is alert and oriented to person, place, and time.  Psychiatric:        Behavior: Behavior normal.        Thought Content: Thought content normal.        Judgment: Judgment normal.     Imaging: MR ABDOMEN WWO CONTRAST  Result Date: 02/24/2020 CLINICAL DATA:  Follow-up characterization of right hepatic mass EXAM: MRI ABDOMEN WITHOUT AND WITH CONTRAST TECHNIQUE: Multiplanar multisequence MR imaging of the abdomen was performed both before and after the administration of intravenous contrast. CONTRAST:  69mL EOVIST GADOXETATE DISODIUM 0.25 MOL/L IV SOLN COMPARISON:  MR abdomen, 09/28/2019, abdominal ultrasound, 10/02/2019 FINDINGS: Lower chest: No acute findings. Hepatobiliary: There is a redemonstrated mass of the right lobe of the liver, hepatic segment VII, which demonstrates intrinsic T1 isointensity, very subtle T2 hyperintensity, a very subtle associated diffusion abnormality, and vivid, homogeneous arterial contrast hyperenhancement. Mass is unchanged in size measuring 5.6 x 5.2 cm. There is no associated lipid signal loss, evidence of central scar, or washout. There is persistent hyperintensity on 20 minutes delayed hepatic biliary phase. Pancreas: No mass, inflammatory changes, or other parenchymal abnormality identified. Spleen:  Within normal limits in size and appearance. Adrenals/Urinary Tract: No masses identified. No evidence of  hydronephrosis. Stomach/Bowel: Visualized portions within the abdomen are unremarkable. Vascular/Lymphatic: No pathologically enlarged lymph nodes identified. No abdominal aortic aneurysm demonstrated. Other:  None. Musculoskeletal: No suspicious bone lesions identified. IMPRESSION: Unchanged size of 5.6 cm mass of the right lobe of the liver, which demonstrates imaging features consistent with focal nodular hyperplasia, confidently diagnosed on this examination with inclusion of delayed hepatobiliary phase. Electronically Signed   By: Eddie Candle M.D.   On: 02/24/2020 14:54    Labs:  CBC: Recent Labs    03/11/20 1137  WBC 4.6  HGB 15.9  HCT 47.9  PLT 215    COAGS: No results for input(s): INR, APTT in the last 8760 hours.  BMP: No results for input(s): NA, K, CL, CO2, GLUCOSE, BUN, CALCIUM, CREATININE, GFRNONAA, GFRAA in the last 8760 hours.  Invalid input(s): CMP  LIVER FUNCTION TESTS: No results for input(s): BILITOT, AST, ALT, ALKPHOS, PROT, ALBUMIN in the last 8760 hours.  TUMOR MARKERS: No results for input(s): AFPTM, CEA, CA199, CHROMGRNA in the  last 8760 hours.  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  All of the questions were answered and there is agreement to proceed. Consent signed and in chart.   Thank you for this interesting consult.  I greatly enjoyed meeting Raymond Bell and look forward to participating in their care.  A copy of this report was sent to the requesting provider on this date.  Electronically Signed: Lavonia Drafts, PA-C 03/11/2020, 12:10 PM   I spent a total of  30 Minutes   in face to face in clinical consultation,  greater than 50% of which was counseling/coordinating care for liver lesion bx

## 2020-03-11 NOTE — Procedures (Signed)
Interventional Radiology Procedure Note  Procedure: CT rt liver mass bx  Complications: None  Estimated Blood Loss: min  Findings: 18 g core x 2

## 2020-03-14 LAB — SURGICAL PATHOLOGY

## 2020-04-26 ENCOUNTER — Other Ambulatory Visit: Payer: Self-pay | Admitting: Family Medicine

## 2020-05-03 ENCOUNTER — Other Ambulatory Visit: Payer: Self-pay | Admitting: Family Medicine

## 2020-05-03 DIAGNOSIS — K769 Liver disease, unspecified: Secondary | ICD-10-CM

## 2020-08-08 ENCOUNTER — Other Ambulatory Visit: Payer: Self-pay | Admitting: Nurse Practitioner

## 2020-08-08 DIAGNOSIS — D376 Neoplasm of uncertain behavior of liver, gallbladder and bile ducts: Secondary | ICD-10-CM

## 2020-09-02 ENCOUNTER — Ambulatory Visit
Admission: RE | Admit: 2020-09-02 | Discharge: 2020-09-02 | Disposition: A | Discharge status: Home / Self Care | Payer: BC Managed Care – PPO | Source: Ambulatory Visit | Attending: Nurse Practitioner | Admitting: Nurse Practitioner

## 2020-09-02 ENCOUNTER — Other Ambulatory Visit: Payer: Self-pay

## 2020-09-02 DIAGNOSIS — D376 Neoplasm of uncertain behavior of liver, gallbladder and bile ducts: Secondary | ICD-10-CM

## 2020-09-02 MED ORDER — GADOBENATE DIMEGLUMINE 529 MG/ML IV SOLN
15.0000 mL | Freq: Once | INTRAVENOUS | Status: AC | PRN
  Administered 2020-09-02: 15 mL via INTRAVENOUS

## 2020-09-02 MED ORDER — GADOXETATE DISODIUM 0.25 MMOL/ML IV SOLN
10.0000 mL | Freq: Once | INTRAVENOUS | Status: AC | PRN
  Administered 2020-09-02: 10 mL via INTRAVENOUS

## 2020-10-10 ENCOUNTER — Other Ambulatory Visit: Payer: BC Managed Care – PPO

## 2021-03-09 IMAGING — CT CT BIOPSY
4 of 7 series · 12 of 32 positions shown, 17 images · non-contrast
Comparison: none

INDICATION: Central right liver lesion, compatible with FNH versus adenoma by
MRI. Lesion is not visible by ultrasound for biopsy.

[Series 2: i-spiral 5.0 b40f · axial · 0.89mm/px · z∈[+1113,+1267]mm · 6 of 62 slices shown, 11 images (1 of 4)]
[im 9/62  soft-tissue]
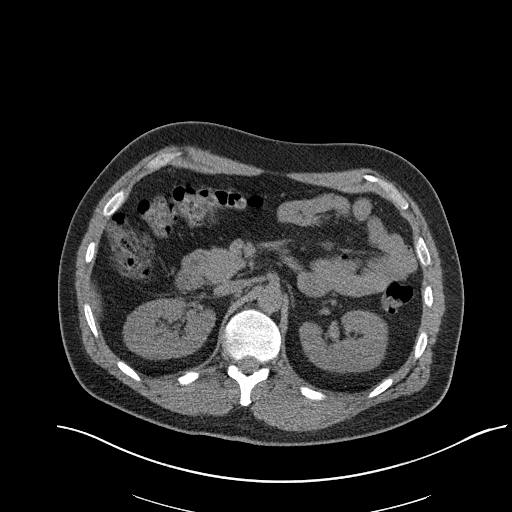
[im 9/62  bone]
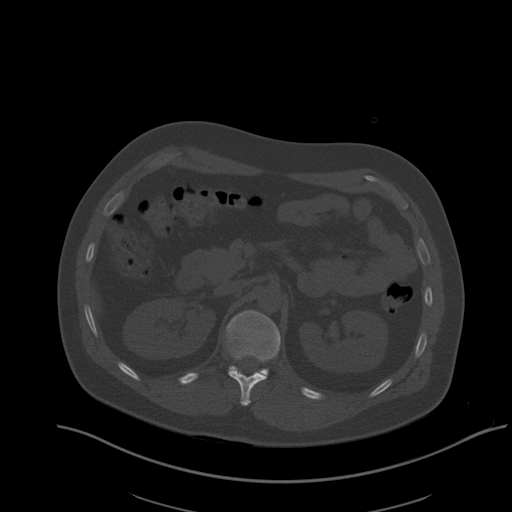
[im 18/62  soft-tissue]
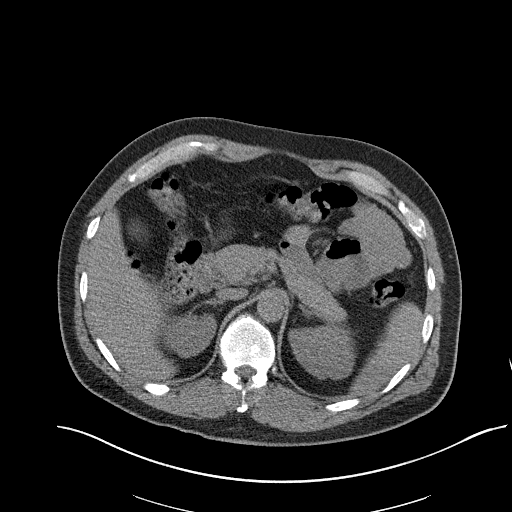
[im 27/62  soft-tissue]
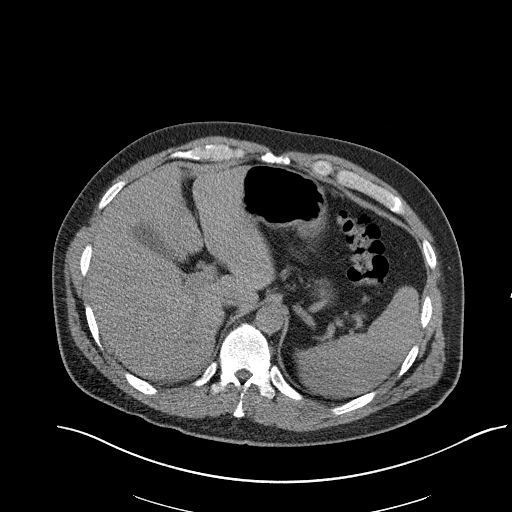
[im 27/62  lung]
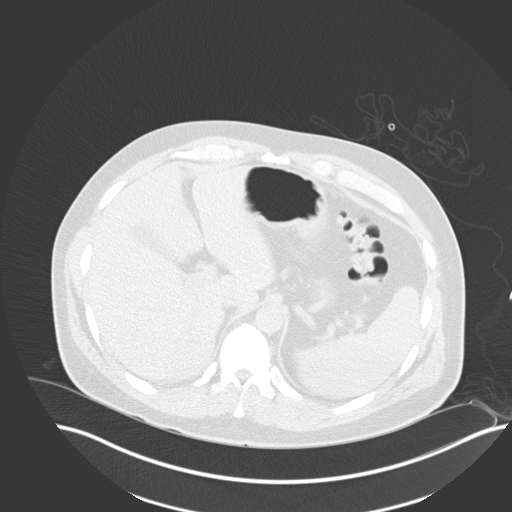
[im 35/62  soft-tissue]
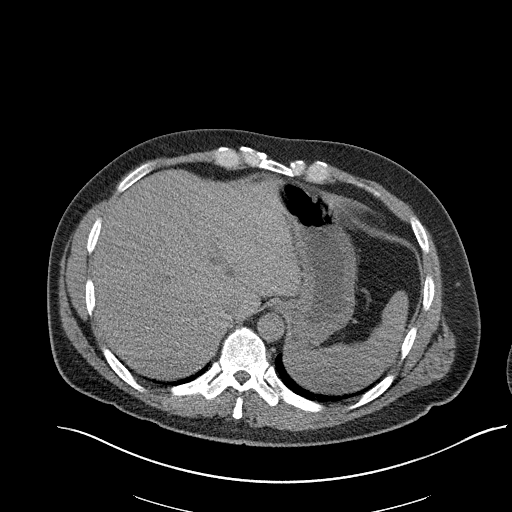
[im 35/62  lung]
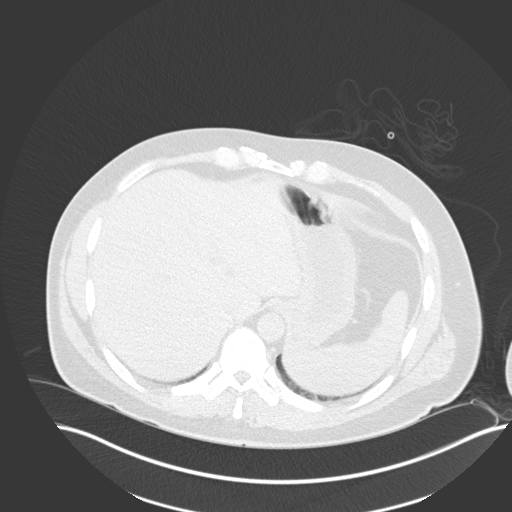
[im 44/62  soft-tissue]
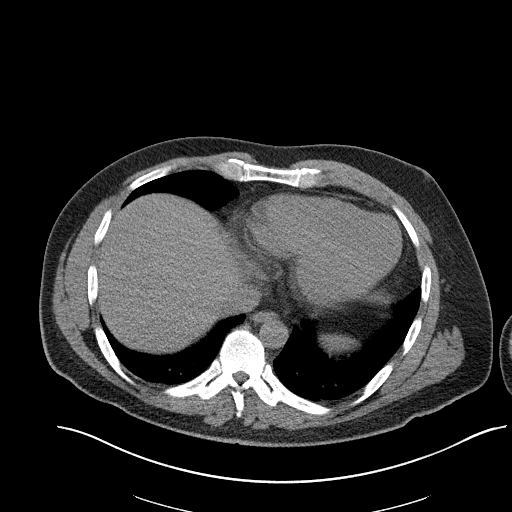
[im 44/62  lung]
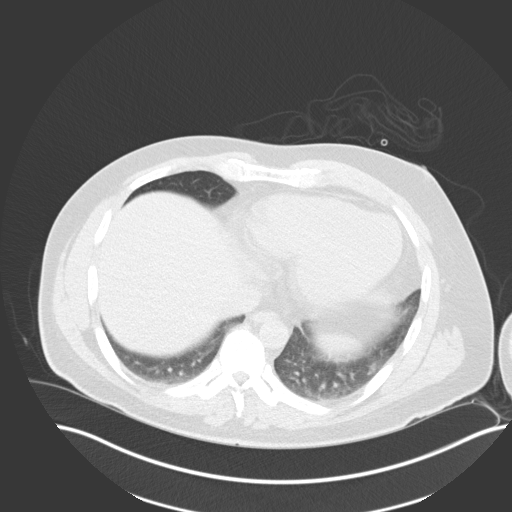
[im 53/62  soft-tissue]
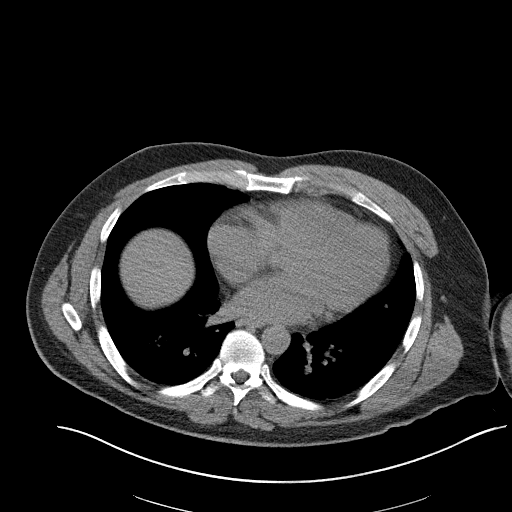
[im 53/62  lung]
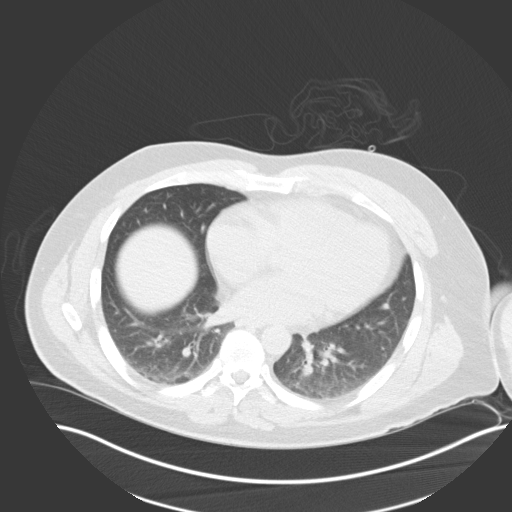

[Series 3: i-spiral 5.0 b40f · axial · 0.89mm/px · z∈[+1200,+1235]mm · 2 of 30 slices shown (2 of 4)]
[im 10/30  soft-tissue]
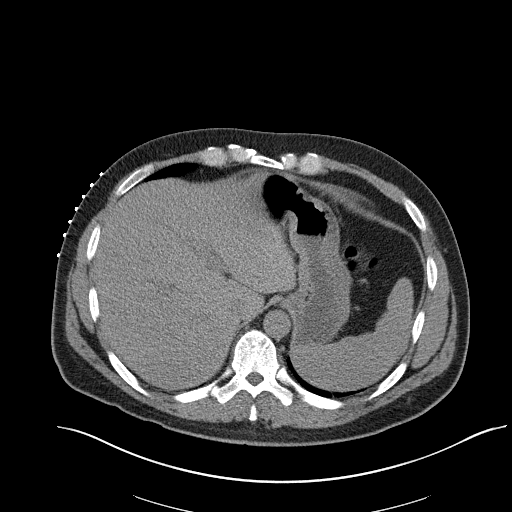
[im 20/30  soft-tissue]
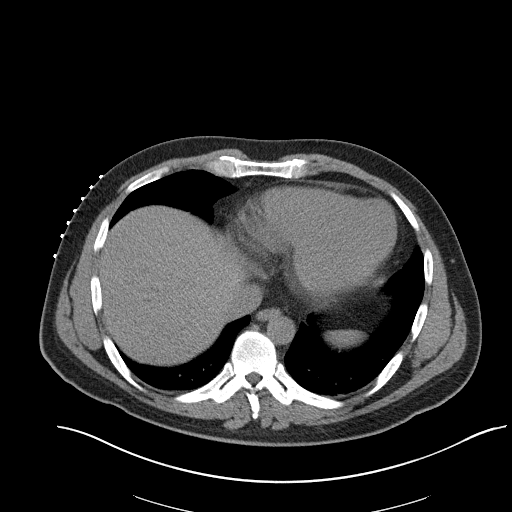

[Series 4: i-spiral 5.0 b40f · axial · 0.89mm/px · z∈[+1200,+1235]mm · 2 of 30 slices shown (3 of 4)]
[im 10/30  soft-tissue]
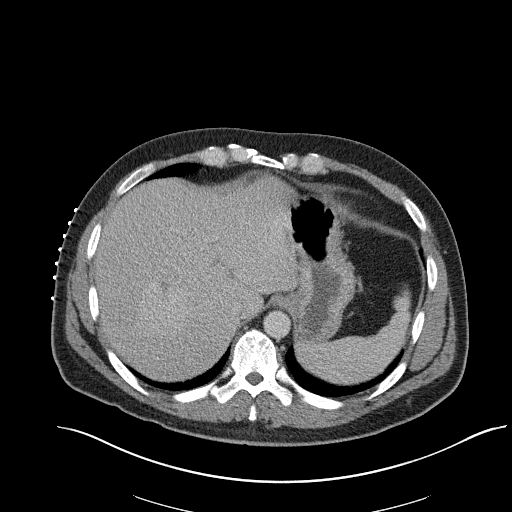
[im 20/30  soft-tissue]
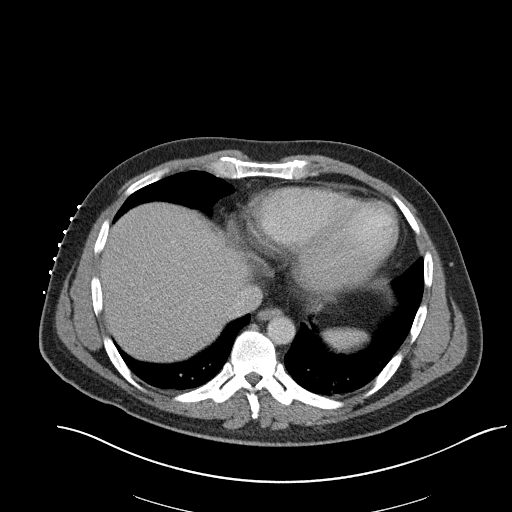

[Series 5: i-spiral 5.0 b40f · axial · 0.89mm/px · z∈[+1200,+1235]mm · 2 of 30 slices shown (4 of 4)]
[im 10/30  soft-tissue]
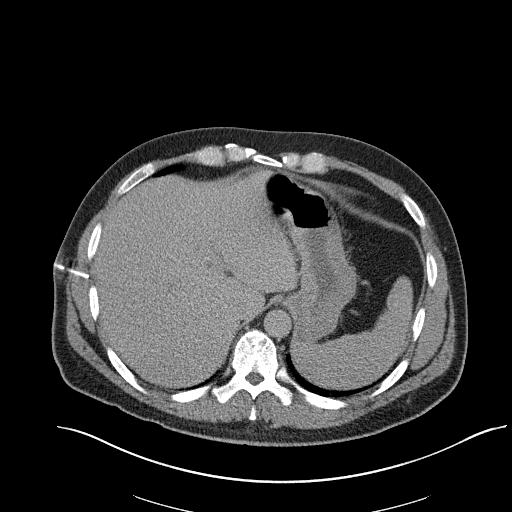
[im 20/30  soft-tissue]
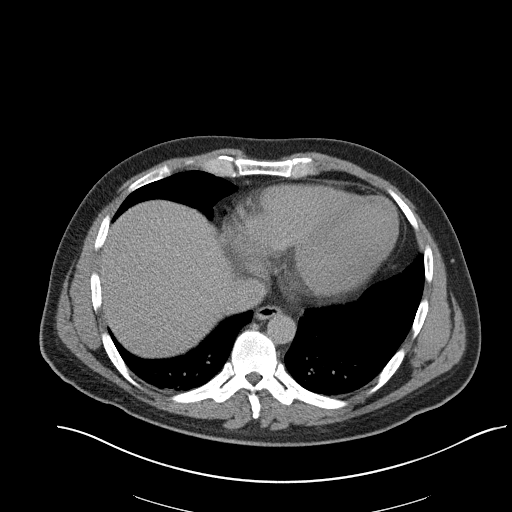

[12 of 32 positions shown; findings below may reference images not displayed]

EXAM:
CT-GUIDED BIOPSY CENTRAL RIGHT LIVER MASS

CONTRAST:  100 cc Omnipaque 300

MEDICATIONS:
1% LIDOCAINE LOCAL

ANESTHESIA/SEDATION:
2.5 mg IV Versed; 150 mcg IV Fentanyl

Moderate Sedation Time:  23 minutes

The patient was continuously monitored during the procedure by the
interventional radiology nurse under my direct supervision.

PROCEDURE:
The procedure, risks, benefits, and alternatives were explained to
the patient. Questions regarding the procedure were encouraged and
answered. The patient understands and consents to the procedure.

Previous imaging reviewed. Patient positioned supine. Noncontrast
localization CT initially performed. Again, the lesion is
challenging to visualize by CT. 100 cc contrast administered. The
lesion is only visualized on the arterial phase. Overlying skin
marked in the mid axillary line through a mid intercostal space.

Under sterile conditions and local anesthesia, CT guidance utilized
to advance a 17 gauge coaxial guide needle into the lesion. Needle
position confirmed with CT and correlated with the arterial phase
exam. [DATE] gauge core biopsies obtained. Samples were intact and non
fragmented. These were placed in formalin. Needle tract occluded
with Gel-Foam. Postprocedure imaging demonstrates no hemorrhage or
hematoma.

Patient tolerated the procedure well without complication. Vital
sign monitoring by nursing staff during the procedure will continue
as patient is in the special procedures unit for post procedure
observation.
FINDINGS: The images document guide needle placement within the central right
liver lesion. Post biopsy images demonstrate no hemorrhage or
hematoma.

COMPLICATIONS:
None immediate.
IMPRESSION: Successful CT-guided core biopsy of the central right liver mass.

## 2021-08-01 ENCOUNTER — Ambulatory Visit
Admission: RE | Admit: 2021-08-01 | Discharge: 2021-08-01 | Disposition: A | Discharge status: Home / Self Care | Payer: BC Managed Care – PPO | Source: Ambulatory Visit | Attending: Family Medicine | Admitting: Family Medicine

## 2021-08-01 ENCOUNTER — Other Ambulatory Visit: Payer: Self-pay

## 2021-08-01 ENCOUNTER — Other Ambulatory Visit: Payer: Self-pay | Admitting: Family Medicine

## 2021-08-01 DIAGNOSIS — Z111 Encounter for screening for respiratory tuberculosis: Secondary | ICD-10-CM

## 2022-01-23 ENCOUNTER — Other Ambulatory Visit: Payer: Self-pay | Admitting: Nurse Practitioner

## 2022-01-23 DIAGNOSIS — D376 Neoplasm of uncertain behavior of liver, gallbladder and bile ducts: Secondary | ICD-10-CM

## 2022-02-26 ENCOUNTER — Ambulatory Visit
Admission: RE | Admit: 2022-02-26 | Discharge: 2022-02-26 | Disposition: A | Discharge status: Home / Self Care | Payer: BC Managed Care – PPO | Source: Ambulatory Visit | Attending: Nurse Practitioner | Admitting: Nurse Practitioner

## 2022-02-26 ENCOUNTER — Other Ambulatory Visit: Payer: Self-pay

## 2022-02-26 DIAGNOSIS — D376 Neoplasm of uncertain behavior of liver, gallbladder and bile ducts: Secondary | ICD-10-CM

## 2022-02-26 MED ORDER — GADOBENATE DIMEGLUMINE 529 MG/ML IV SOLN
20.0000 mL | Freq: Once | INTRAVENOUS | Status: AC | PRN
  Administered 2022-02-26: 20 mL via INTRAVENOUS

## 2024-08-06 ENCOUNTER — Other Ambulatory Visit: Payer: Self-pay

## 2024-08-06 ENCOUNTER — Emergency Department (HOSPITAL_BASED_OUTPATIENT_CLINIC_OR_DEPARTMENT_OTHER)
Admission: EM | Admit: 2024-08-06 | Discharge: 2024-08-06 | Disposition: A | Discharge status: Home / Self Care | Attending: Emergency Medicine | Admitting: Emergency Medicine

## 2024-08-06 ENCOUNTER — Encounter (HOSPITAL_BASED_OUTPATIENT_CLINIC_OR_DEPARTMENT_OTHER): Payer: Self-pay

## 2024-08-06 DIAGNOSIS — R21 Rash and other nonspecific skin eruption: Secondary | ICD-10-CM | POA: Insufficient documentation

## 2024-08-06 MED ORDER — PERMETHRIN 5 % EX CREA: TOPICAL_CREAM | CUTANEOUS | 1 refills | Status: AC

## 2024-08-06 NOTE — Discharge Instructions (Signed)
 You were seen today for a rash.  Given that your wife has a similar rash, bites or infestations would be high on the list.  Given this, you will be treated for scabies.  Additional consideration would be contact dermatitis or reaction to some sort of detergent or lotion.  Make sure that you clean all your bed linens and close in hot water.  Avoid dyes in detergents or lotions.  You may take Benadryl for itching.

## 2024-08-06 NOTE — ED Triage Notes (Addendum)
 Pt reports rash bilaterally on top of feet. Pt denies any new laundry detergent,environment. Pt report progressively worsening itching. Pt reports OTC meds with no relief

## 2024-08-06 NOTE — ED Provider Notes (Signed)
 [place] EMERGENCY DEPARTMENT AT [facility] Provider Note   CSN: [id number] Arrival date & time: 08/06/24  0408     Patient presents with: Rash   Raymond Bell is a 43 y.o. male.   HPI     This is a 43 year old male who presents with rash on bilateral feet.  Patient reports 2-day history of multiple bumps on the bilateral feet that are itchy in nature.  Denies new exposures including detergents, lotions.  He does not believe he was bitten and has not been been outside with shoes off but did take a walk in the woods on Monday with his wife.  Wife has similar lesions on her feet.  He first noted the lesions after getting out of bed.  Subsequently his wife had lesions 8 hours later.  No fevers or systemic symptoms.  Prior to Admission medications   Medication Sig Start Date End Date Taking? Authorizing Provider  permethrin  (ELIMITE ) 5 % cream Apply to affected area once 08/06/24  Yes Lilinoe Acklin, Charmaine FALCON, MD  acetaminophen  (TYLENOL ) 325 MG tablet Take 650 mg by mouth every 6 (six) hours as needed for mild pain.    [provider]  MELATONIN PO Take 1 tablet by mouth at bedtime.    [provider]  naproxen sodium (ALEVE) 220 MG tablet Take 440 mg by mouth daily as needed (pain).    [provider]    Allergies: Patient has no known allergies.    Review of Systems  Constitutional:  Negative for fever.  Skin:  Positive for rash.  All other systems reviewed and are negative.   Updated Vital Signs BP (!) 137/98   Pulse 82   Temp 98.2 F (36.8 C)   Resp 18   SpO2 96%   Physical Exam Vitals and nursing note reviewed.  Constitutional:      Appearance: He is well-developed. He is not ill-appearing.  HENT:     Head: Normocephalic and atraumatic.  Eyes:     Pupils: Pupils are equal, round, and reactive to light.  Cardiovascular:     Rate and Rhythm: Normal rate and regular rhythm.  Pulmonary:     Effort: Pulmonary effort is normal. No  respiratory distress.  Musculoskeletal:        General: No swelling.     Cervical back: Neck supple.  Lymphadenopathy:     Cervical: No cervical adenopathy.  Skin:    General: Skin is warm and dry.     Comments: Focused examination of the bilateral feet with scattered papules most densely over the left foot, there is a slightly erythematous base, only involves the dorsum of the foot, no plantar lesions, occasional scattered similar lesion on the left leg  Neurological:     Mental Status: He is alert and oriented to person, place, and time.     (all labs ordered are listed, but only abnormal results are displayed) Labs Reviewed - No data to display  EKG: None  Radiology: No results found.   Procedures   Medications Ordered in the ED - No data to display                                  Medical Decision Making Risk Prescription drug management.   This patient presents to the ED for concern of rash, this involves an extensive number of treatment options, and is a complaint that carries with it a high  risk of complications and morbidity.  I considered the following differential and admission for this acute, potentially life threatening condition.  The differential diagnosis includes bite, contact dermatitis, infestation, tinea  MDM:    This is a 43 year old male who presents with itchy rash to bilateral feet.  He has discrete papules most densely on the left dorsum of the foot.  This looks most consistent with a bite of some sort although the patient adamantly denies being bitten by ants.  Wife has similar symptoms.  This would suggest a contact element or shared exposure.  Scabies is a consideration.  Would make contact dermatitis less likely unless they both reacted similarly.  Recommend Benadryl for itching.  Will treat with permethrin .  Recommend monitoring at home sheets and including in hot water.  Not improving needs to see his primary doctor.  No signs or symptoms of  cellulitis or systemic infection at this time.  (Labs, imaging, consults)  Labs: I Ordered, and personally interpreted labs.  The pertinent results include: None  Imaging Studies ordered: I ordered imaging studies including none I independently visualized and interpreted imaging. I agree with the radiologist interpretation  Additional history obtained from chart review.  External records from outside source obtained and reviewed including prior evaluations  Cardiac Monitoring: The patient was not maintained on a cardiac monitor.  If on the cardiac monitor, I personally viewed and interpreted the cardiac monitored which showed an underlying rhythm of: N/A  Reevaluation: After the interventions noted above, I reevaluated the patient and found that they have :stayed the same  Social Determinants of Health:  lives independently  Disposition: Discharge  Co morbidities that complicate the patient evaluation History reviewed. No pertinent past medical history.   Medicines Meds ordered this encounter  Medications   permethrin  (ELIMITE ) 5 % cream    Sig: Apply to affected area once    Dispense:  60 g    Refill:  1    I have reviewed the patients home medicines and have made adjustments as needed  Problem List / ED Course: Problem List Items Addressed This Visit   None Visit Diagnoses       Rash of both feet    -  Primary                Final diagnoses:  Rash of both feet    ED Discharge Orders          Ordered    permethrin  (ELIMITE ) 5 % cream        08/06/24 0441               Bari Charmaine FALCON, MD 08/06/24 [phone]
